# Patient Record
Sex: Male | Born: 1990 | Race: White | Hispanic: No | Marital: Married | Smoking: Never smoker
Health system: Southern US, Community
[De-identification: ages and names within clinical notes are randomized; demographics above are authoritative.]

---

## 2015-06-05 ENCOUNTER — Emergency Department (HOSPITAL_BASED_OUTPATIENT_CLINIC_OR_DEPARTMENT_OTHER)
Admission: EM | Admit: 2015-06-05 | Discharge: 2015-06-05 | Disposition: A | Discharge status: Home / Self Care | Payer: Worker's Compensation | Attending: Emergency Medicine | Admitting: Emergency Medicine

## 2015-06-05 ENCOUNTER — Encounter (HOSPITAL_BASED_OUTPATIENT_CLINIC_OR_DEPARTMENT_OTHER): Payer: Self-pay | Admitting: *Deleted

## 2015-06-05 ENCOUNTER — Emergency Department (HOSPITAL_BASED_OUTPATIENT_CLINIC_OR_DEPARTMENT_OTHER): Payer: Worker's Compensation

## 2015-06-05 DIAGNOSIS — Y999 Unspecified external cause status: Secondary | ICD-10-CM | POA: Insufficient documentation

## 2015-06-05 DIAGNOSIS — S61240A Puncture wound with foreign body of right index finger without damage to nail, initial encounter: Secondary | ICD-10-CM | POA: Diagnosis not present

## 2015-06-05 DIAGNOSIS — W228XXA Striking against or struck by other objects, initial encounter: Secondary | ICD-10-CM | POA: Diagnosis not present

## 2015-06-05 DIAGNOSIS — S6991XA Unspecified injury of right wrist, hand and finger(s), initial encounter: Secondary | ICD-10-CM | POA: Diagnosis present

## 2015-06-05 DIAGNOSIS — Y929 Unspecified place or not applicable: Secondary | ICD-10-CM | POA: Diagnosis not present

## 2015-06-05 DIAGNOSIS — Y9389 Activity, other specified: Secondary | ICD-10-CM | POA: Diagnosis not present

## 2015-06-05 DIAGNOSIS — S61249A Puncture wound with foreign body of unspecified finger without damage to nail, initial encounter: Secondary | ICD-10-CM

## 2015-06-05 NOTE — Discharge Instructions (Signed)
Watch for signs of infection from the foreign body. Follow-up with hand surgery if symptoms worsen or pain increases.  Puncture Wound A puncture wound is an injury that is caused by a sharp, thin object that goes through (penetrates) your skin. Usually, a puncture wound does not leave a large opening in your skin, so it may not bleed a lot. However, when you get a puncture wound, dirt or other materials (foreign bodies) can be forced into your wound and break off inside. This increases the chance of infection, such as tetanus. CAUSES Puncture wounds are caused by any sharp, thin object that goes through your skin, such as:  Animal teeth, as with an animal bite.  Sharp, pointed objects, such as nails, splinters of glass, fishhooks, and needles. SYMPTOMS Symptoms of a puncture wound include:  Pain.  Bleeding.  Swelling.  Bruising.  Fluid leaking from the wound.  Numbness, tingling, or loss of function. DIAGNOSIS This condition is diagnosed with a medical history and physical exam. Your wound will be checked to see if it contains any foreign bodies. You may also have X-rays or other imaging tests. TREATMENT Treatment for a puncture wound depends on how serious the wound is. It also depends on whether the wound contains any foreign bodies. Treatment for all types of puncture wounds usually starts with:  Controlling the bleeding.  Washing out the wound with a germ-free (sterile) salt-water solution.  Checking the wound for foreign bodies. Treatment may also include:  Having the wound opened surgically to remove a foreign object.  Closing the wound with stitches (sutures) if it continues to bleed.  Covering the wound with antibiotic ointments and a bandage (dressing).  Receiving a tetanus shot.  Receiving a rabies vaccine. HOME CARE INSTRUCTIONS Medicines  Take or apply over-the-counter and prescription medicines only as told by your health care provider.  If you were  prescribed an antibiotic, take or apply it as told by your health care provider. Do not stop using the antibiotic even if your condition improves. Wound Care  There are many ways to close and cover a wound. For example, a wound can be covered with sutures, skin glue, or adhesive strips. Follow instructions from your health care provider about:  How to take care of your wound.  When and how you should change your dressing.  When you should remove your dressing.  Removing whatever was used to close your wound.  Keep the dressing dry as told by your health care provider. Do not take baths, swim, use a hot tub, or do anything that would put your wound underwater until your health care provider approves.  Clean the wound as told by your health care provider.  Do not scratch or pick at the wound.  Check your wound every day for signs of infection. Watch for:  Redness, swelling, or pain.  Fluid, blood, or pus. General Instructions  Raise (elevate) the injured area above the level of your heart while you are sitting or lying down.  If your puncture wound is in your foot, ask your health care provider if you need to avoid putting weight on your foot and for how long.  Keep all follow-up visits as told by your health care provider. This is important. SEEK MEDICAL CARE IF:  You received a tetanus shot and you have swelling, severe pain, redness, or bleeding at the injection site.  You have a fever.  Your sutures come out.  You notice a bad smell coming from your  wound or your dressing.  You notice something coming out of your wound, such as wood or glass.  Your pain is not controlled with medicine.  You have increased redness, swelling, or pain at the site of your wound.  You have fluid, blood, or pus coming from your wound.  You notice a change in the color of your skin near your wound.  You need to change the dressing frequently due to fluid, blood, or pus draining from  your wound.  You develop a new rash.  You develop numbness around your wound. SEEK IMMEDIATE MEDICAL CARE IF:  You develop severe swelling around your wound.  Your pain suddenly increases and is severe.  You develop painful skin lumps.  You have a red streak going away from your wound.  The wound is on your hand or foot and you cannot properly move a finger or toe.  The wound is on your hand or foot and you notice that your fingers or toes look pale or bluish.   This information is not intended to replace advice given to you by your health care provider. Make sure you discuss any questions you have with your health care provider.   Document Released: 10/27/2004 Document Revised: 10/08/2014 Document Reviewed: 03/12/2014 Elsevier Interactive Patient Education [employer]

## 2015-06-05 NOTE — ED Notes (Signed)
Pt had questions regarding billing and insurance, Bethann Berkshirerisha from Registration at bedside.

## 2015-06-05 NOTE — ED Notes (Signed)
Metal welding injury to his right 2nd digit. States he thinks he has a piece of metal inside his finger.

## 2015-06-05 NOTE — ED Provider Notes (Signed)
CSN: [id number]     Arrival date & time 06/05/15  1858 History  By signing my name below, I, Nathaniel Scott, attest that this documentation has been prepared under the direction and in the presence of No att. providers found. Electronically Signed: Linus GalasMaharshi Scott, ED Scribe. 06/05/2015. 7:17 PM.   Chief Complaint  Patient presents with  . Finger Injury   The history is provided by the patient. No language interpreter was used.   HPI Comments: Nathaniel Scott is a 25 y.o. male who presents to the Emergency Department with no PMHx complaining of a finger injury to his right index finger that occurred today while welding. Pt states he got a piece of metal stuck in his finger. He states the metal is a combination and copper, zinc and iron. Pt states it may have traces of radioactive material. Pt denies any fevers or chills.   History reviewed. No pertinent past medical history. History reviewed. No pertinent past surgical history. No family history on file. Social History  Substance Use Topics  . Smoking status: Never Smoker   . Smokeless tobacco: None  . Alcohol Use: No    Review of Systems  Constitutional: Negative for fever and chills.  Skin: Positive for wound.   Allergies  Review of patient's allergies indicates no known allergies.  Home Medications   Prior to Admission medications   Not on File   BP 131/79 mmHg  Pulse 65  Temp(Src) 98.2 F (36.8 C) (Oral)  Resp 19  Ht 5\' 9"  (1.753 m)  Wt 210 lb (95.255 kg)  BMI 31.00 kg/m2  SpO2 99%   Physical Exam  Constitutional: He appears well-developed and well-nourished.  HENT:  Head: Normocephalic.  Cardiovascular: Normal rate.   Pulmonary/Chest: Effort normal.  Musculoskeletal:  2 mm burn to the distal, lateral aspect of the pad on the right index finger with mild numbness and tenderness; no fluctuance, erythema or drainage; good flexion and extension of the finger  Neurological: He is alert.  Skin: Skin is warm and dry.   Nursing note and vitals reviewed.  ED Course  Procedures  DIAGNOSTIC STUDIES: Oxygen Saturation is 97% on room air, normal by my interpretation.    COORDINATION OF CARE: 7:11 PM Will order x-ray of right index finger. Discussed treatment plan with pt at bedside and pt agreed to plan.  Labs Review Labs Reviewed - No data to display  Imaging Review Dg Finger Index Right  06/05/2015  CLINICAL DATA:  Welding injury. EXAM: RIGHT INDEX FINGER 2+V COMPARISON:  None. FINDINGS: There is no fracture or dislocation. No bony destruction. There is metallic foreign material at the palmar-radial aspect of the second distal phalanx, predominantly superficial and possibly on the skin surface. No soft tissue gas. IMPRESSION: No bony abnormality. Metallic soft tissue material at the radial palmar aspect of the second distal phalanx. Electronically Signed   By: Ellery Plunkaniel R Mitchell M.D.   On: 06/05/2015 20:26   I have personally reviewed and evaluated these images and lab results as part of my medical decision-making.   EKG Interpretation None      MDM   Final diagnoses:  Puncture wound of finger with foreign body without damage to nail, initial encounter    Patient with welding injury to finger. Likely some pieces of metal in the finger but at this point I think going after it is more risks with benefit would get from moving it. No sign of infection at this time. Will discharge to follow-up with hand  surgery as needed.  I personally performed the services described in this documentation, which was scribed in my presence. The recorded information has been reviewed and is accurate.      Benjiman Core, MD 06/05/15 [phone]

## 2017-10-19 IMAGING — DX DG FINGER INDEX 2+V*R*
3 series · 3 of 3 positions shown · non-contrast
Comparison: None.

CLINICAL DATA: Welding injury.

EXAM:
RIGHT INDEX FINGER 2+V

[finger ap]
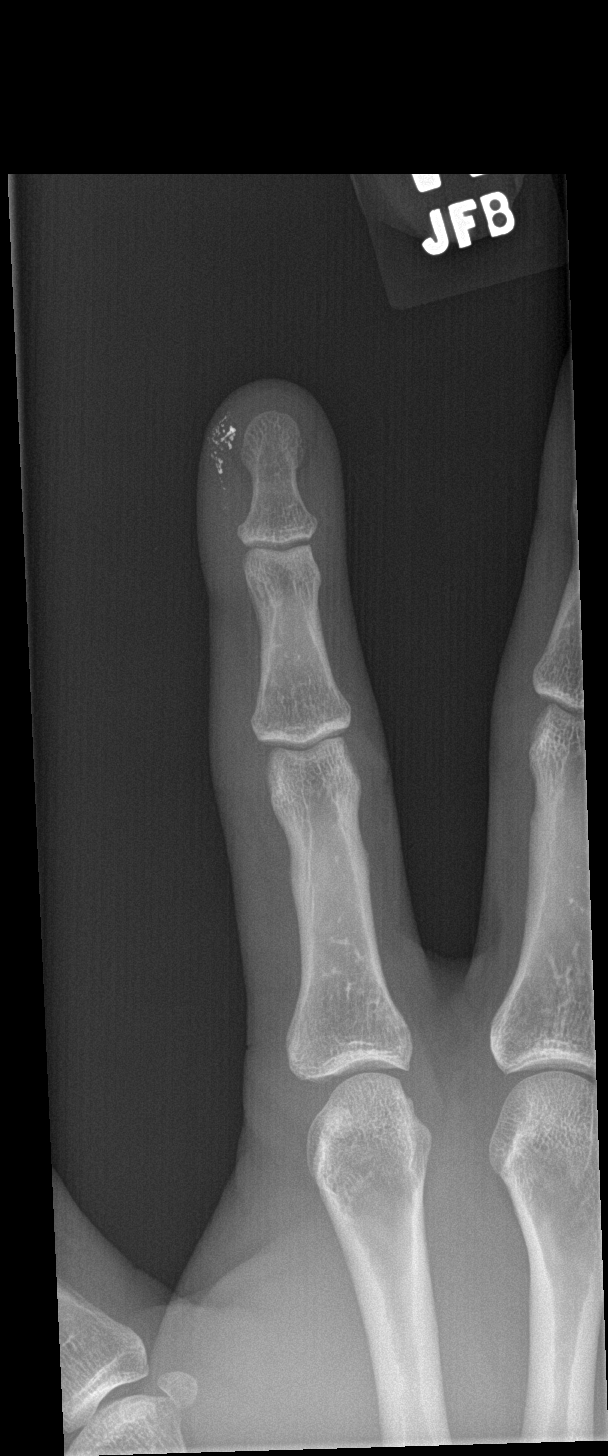

[finger obl]
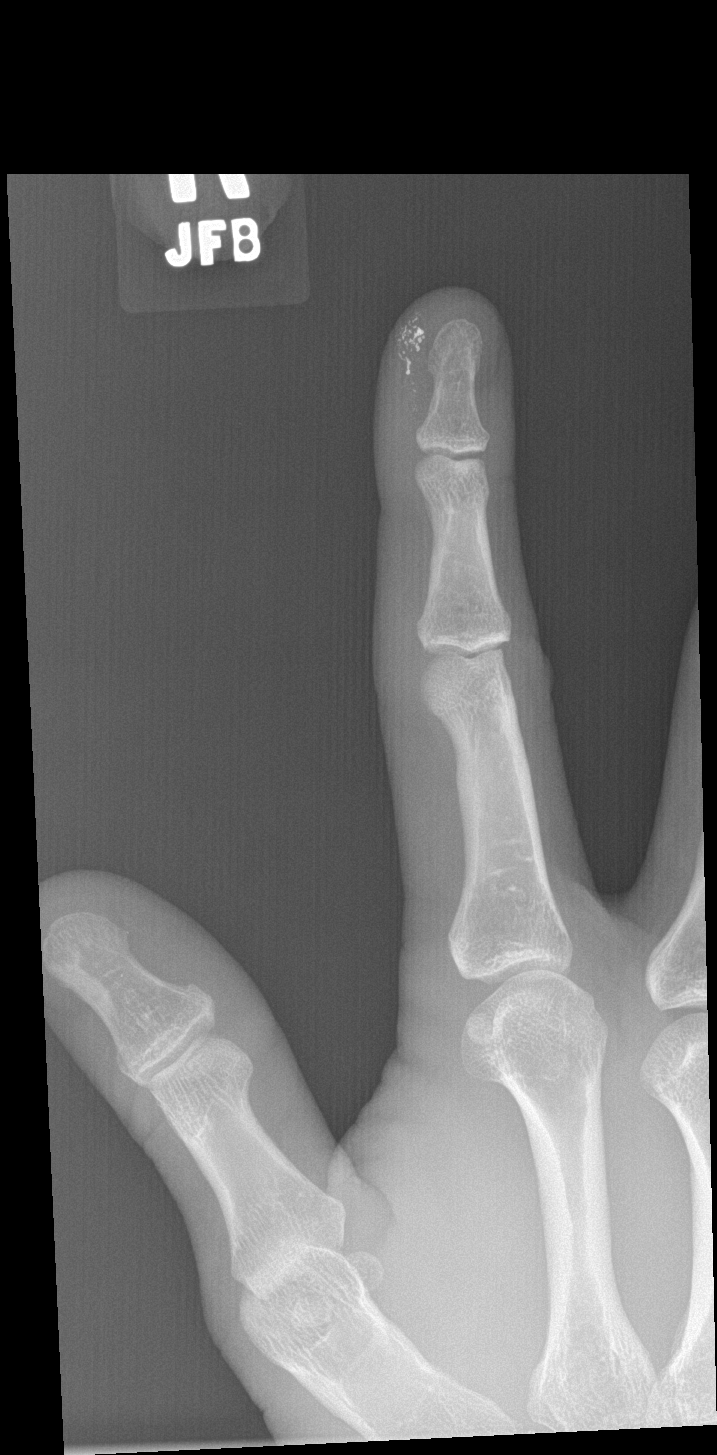

[finger lat]
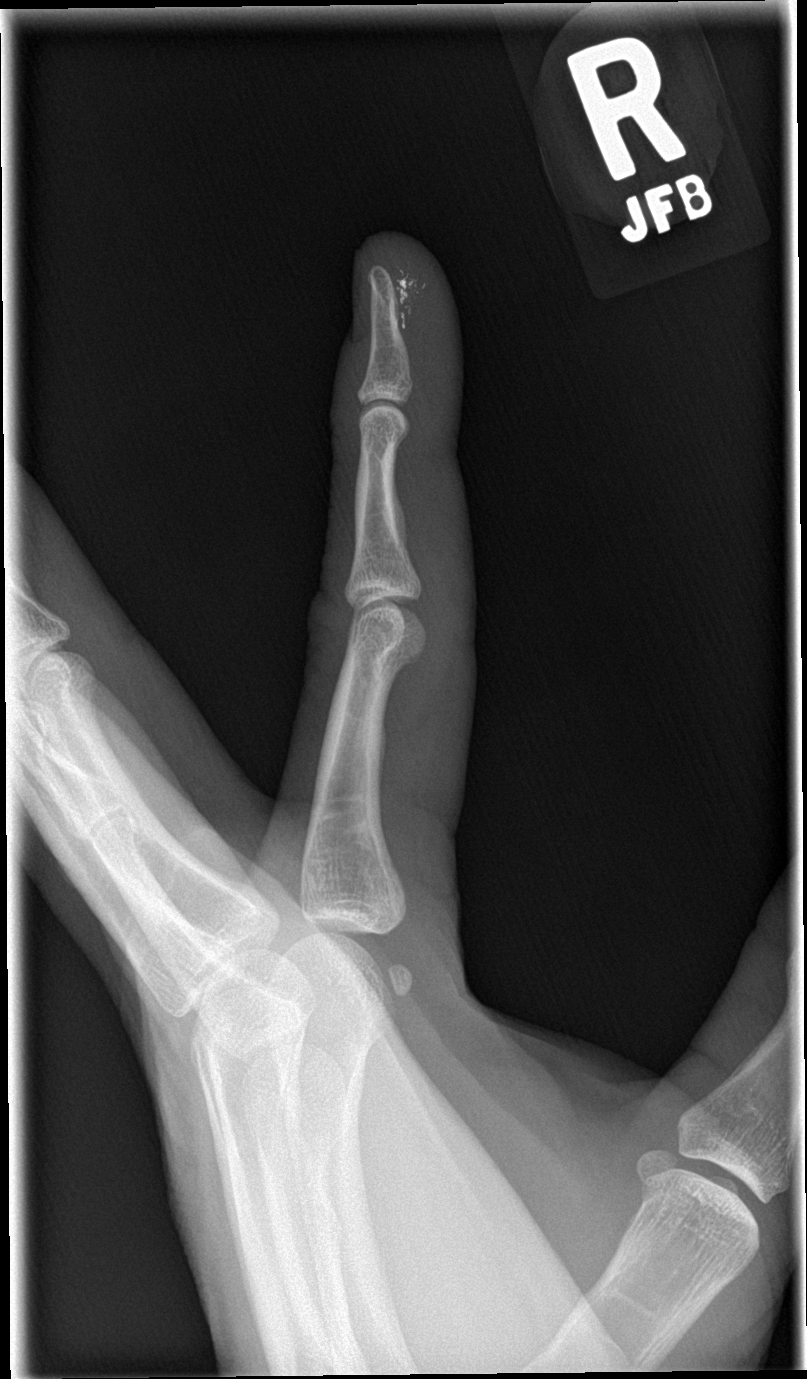

[3 of 3 positions shown; findings below may reference images not displayed]

FINDINGS: There is no fracture or dislocation. No bony destruction. There is
metallic foreign material at the palmar-radial aspect of the second
distal phalanx, predominantly superficial and possibly on the skin
surface. No soft tissue gas.
IMPRESSION: No bony abnormality. Metallic soft tissue material at the radial
palmar aspect of the second distal phalanx.
